# Patient Record
Sex: Male | Born: 1960 | Race: White | Hispanic: No | Marital: Married | Smoking: Current every day smoker
Health system: Southern US, Community
[De-identification: ages and names within clinical notes are randomized; demographics above are authoritative.]

## PROBLEM LIST (undated history)

## (undated) DIAGNOSIS — I10 Essential (primary) hypertension: Secondary | ICD-10-CM

## (undated) DIAGNOSIS — E119 Type 2 diabetes mellitus without complications: Secondary | ICD-10-CM

## (undated) DIAGNOSIS — I219 Acute myocardial infarction, unspecified: Secondary | ICD-10-CM

## (undated) HISTORY — PX: APPENDECTOMY: SHX54

---

## 2004-04-26 DIAGNOSIS — I219 Acute myocardial infarction, unspecified: Secondary | ICD-10-CM

## 2004-04-26 HISTORY — DX: Acute myocardial infarction, unspecified: I21.9

## 2004-04-26 HISTORY — PX: CORONARY ANGIOPLASTY WITH STENT PLACEMENT: SHX49

## 2017-09-16 ENCOUNTER — Encounter: Payer: Self-pay | Admitting: *Deleted

## 2017-09-16 ENCOUNTER — Ambulatory Visit (INDEPENDENT_AMBULATORY_CARE_PROVIDER_SITE_OTHER): Payer: No Typology Code available for payment source

## 2017-09-16 ENCOUNTER — Ambulatory Visit
Admission: EM | Admit: 2017-09-16 | Discharge: 2017-09-16 | Disposition: A | Discharge status: Home / Self Care | Payer: No Typology Code available for payment source | Attending: Family Medicine | Admitting: Family Medicine

## 2017-09-16 DIAGNOSIS — I509 Heart failure, unspecified: Secondary | ICD-10-CM

## 2017-09-16 HISTORY — DX: Acute myocardial infarction, unspecified: I21.9

## 2017-09-16 HISTORY — DX: Essential (primary) hypertension: I10

## 2017-09-16 HISTORY — DX: Type 2 diabetes mellitus without complications: E11.9

## 2017-09-16 NOTE — Discharge Instructions (Addendum)
Please go straight to the ER. [address], [place] [address]  Best of luck.  Take care  Dr. Adriana Simas

## 2017-09-16 NOTE — ED Provider Notes (Signed)
[facility]  CSN: [id number] Arrival date & time: 09/16/17  1451  History   Chief Complaint Congestion  HPI  57 year old male presents with congestion.  Patient states that on Wednesday he developed diarrhea and was not feeling well.  On Thursday he developed head congestion.  Today he has had congestion and cough.  He states that he slept well last night but has had some difficulty with shortness of breath when lying down today.  He denies any lower extremity edema.  No fever or chills.  His primary concern is his congestion.  He is a current smoker.  He has a cardiac history.  No known history of CHF.  He denies a history of COPD.  Shortness of breath is worse when lying down.  When he sits up it improves.  No other associated symptoms.  No other complaints.  Past Medical History:  Diagnosis Date  . Diabetes mellitus without complication (HCC)   . Heart attack (HCC) 2006  . Hypertension    Past Surgical History:  Procedure Laterality Date  . APPENDECTOMY    . CORONARY ANGIOPLASTY WITH STENT PLACEMENT  2006   Home Medications    Prior to Admission medications   Medication Sig Start Date End Date Taking? Authorizing Provider  amLODipine (NORVASC) 5 MG tablet Take 5 mg by mouth daily.   Yes Emergency, Nurse, RN  aspirin EC 81 MG tablet Take 81 mg by mouth daily.   Yes Emergency, Nurse, RN  enalapril-hydrochlorothiazide (VASERETIC) 10-25 MG tablet Take 1 tablet by mouth daily.   Yes Emergency, Nurse, RN  glimepiride (AMARYL) 4 MG tablet Take 4 mg by mouth daily with breakfast.   Yes Emergency, Nurse, RN  metFORMIN (GLUCOPHAGE) 1000 MG tablet Take 1,000 mg by mouth 2 (two) times daily with a meal.   Yes Emergency, Nurse, RN  Metoprolol-hydroCHLOROthiazide (METOPROLOL-HCTZ ER PO) Take 50 mg by mouth.   Yes Emergency, Nurse, RN  Omega-3 Fatty Acids (FISH OIL) 1000 MG CAPS Take by mouth 2 (two) times daily.   Yes Emergency, Nurse, RN  simvastatin (ZOCOR) 20 MG tablet Take 20  mg by mouth daily.   Yes Emergency, Nurse, RN  vitamin E 400 UNIT capsule Take 400 Units by mouth daily.   Yes Emergency, Nurse, RN   Social History Social History   Tobacco Use  . Smoking status: Current Every Day Smoker  . Smokeless tobacco: Never Used  . Tobacco comment: 0-.5 pack a week  Substance Use Topics  . Alcohol use: Not on file  . Drug use: Not on file    Allergies   Patient has no allergy information on record.   Review of Systems Review of Systems  Constitutional: Negative for fever.  HENT: Positive for congestion.   Respiratory: Positive for cough and shortness of breath.    Physical Exam Triage Vital Signs ED Triage Vitals [09/16/17 1506]  Enc Vitals Group     BP (!) 148/81     Pulse Rate 73     Resp 20     Temp 98.4 F (36.9 C)     Temp Source Oral     SpO2 92 %     Weight 240 lb (108.9 kg)     Height  (1.753 m)     Head Circumference      Peak Flow      Pain Score 0     Pain Loc      Pain Edu?      Excl.  in GC?    Updated Vital Signs BP (!) 148/81   Pulse 73   Temp 98.4 F (36.9 C) (Oral)   Resp 20   Ht  (1.753 m)   Wt 240 lb (108.9 kg)   SpO2 92%   BMI 35.44 kg/m  Physical Exam  Constitutional: He is oriented to person, place, and time. He appears well-developed. No distress.  HENT:  Head: Normocephalic and atraumatic.  Eyes: Conjunctivae are normal. No scleral icterus.  Cardiovascular: Normal rate and regular rhythm.  1-2+ pitting lower extremity edema.  Pulmonary/Chest: Effort normal.  Basilar crackles.  Abdominal: Soft. There is no tenderness.  Neurological: He is alert and oriented to person, place, and time.  Psychiatric: He has a normal mood and affect. His behavior is normal.  Nursing note and vitals reviewed.  UC Treatments / Results  Labs (all labs ordered are listed, but only abnormal results are displayed) Labs Reviewed - No data to display  EKG None  Radiology Dg Chest 2 View  Result Date:  09/16/2017 CLINICAL DATA:  Cough and short of breath EXAM: CHEST - 2 VIEW COMPARISON:  None. FINDINGS: Heart size upper normal. Prominent interstitial lung markings bilaterally. No focal consolidation or effusion. Lung volume normal. Atherosclerotic calcification aortic arch IMPRESSION: Heart size upper normal. Interstitial edema most compatible with mild heart failure. No effusion. Electronically Signed   By: Marlan Palau M.D.   On: 09/16/2017 15:34    Procedures Procedures (including critical care time)  Medications Ordered in UC Medications - No data to display  Initial Impression / Assessment and Plan / UC Course  I have reviewed the triage vital signs and the nursing notes.  Pertinent labs & imaging results that were available during my care of the patient were reviewed by me and considered in my medical decision making (see chart for details).    57 year old male presents with cough, congestion, shortness of breath particularly when lying flat.  Clinically appears to be experiencing mild CHF.  X-ray consistent with this.  Sending to the ER for diuresis, further work-up, and possible admission.  Final Clinical Impressions(s) / UC Diagnoses   Final diagnoses:  Acute congestive heart failure, unspecified heart failure type [facility])     Discharge Instructions     Please go straight to the ER. [address], [place], [place] [address]  Best of luck.  Take care  Dr. Adriana Simas     ED Prescriptions    None     Controlled Substance Prescriptions [place] Controlled Substance Registry consulted? Not Applicable   Tommie Sams, DO 09/16/17 1550

## 2017-09-16 NOTE — ED Triage Notes (Signed)
Pt c/o chest congestion. States he is very congested and cannot breathe when he lies down.

## 2019-02-14 IMAGING — CR DG CHEST 2V
2 series · 2 of 2 positions shown · non-contrast
Comparison: None.

CLINICAL DATA: Cough and short of breath

EXAM:
CHEST - 2 VIEW

[chest pa]
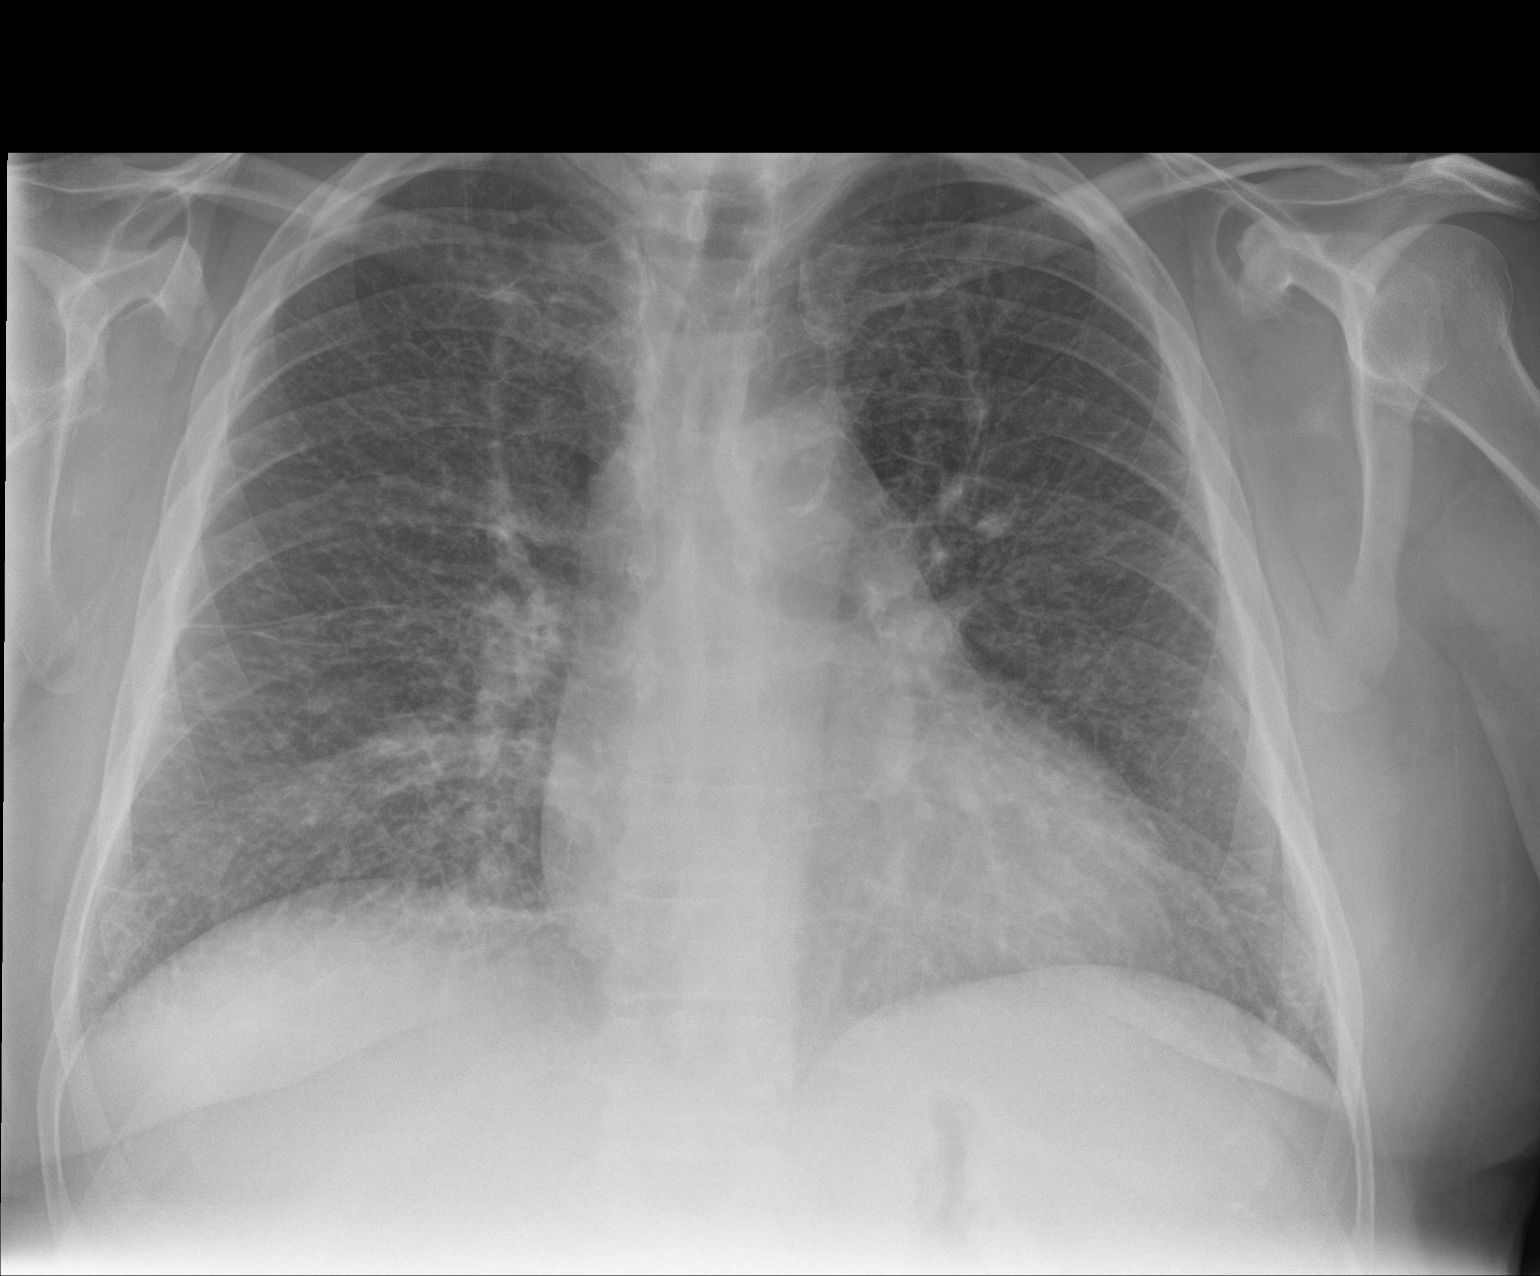

[chest lat]
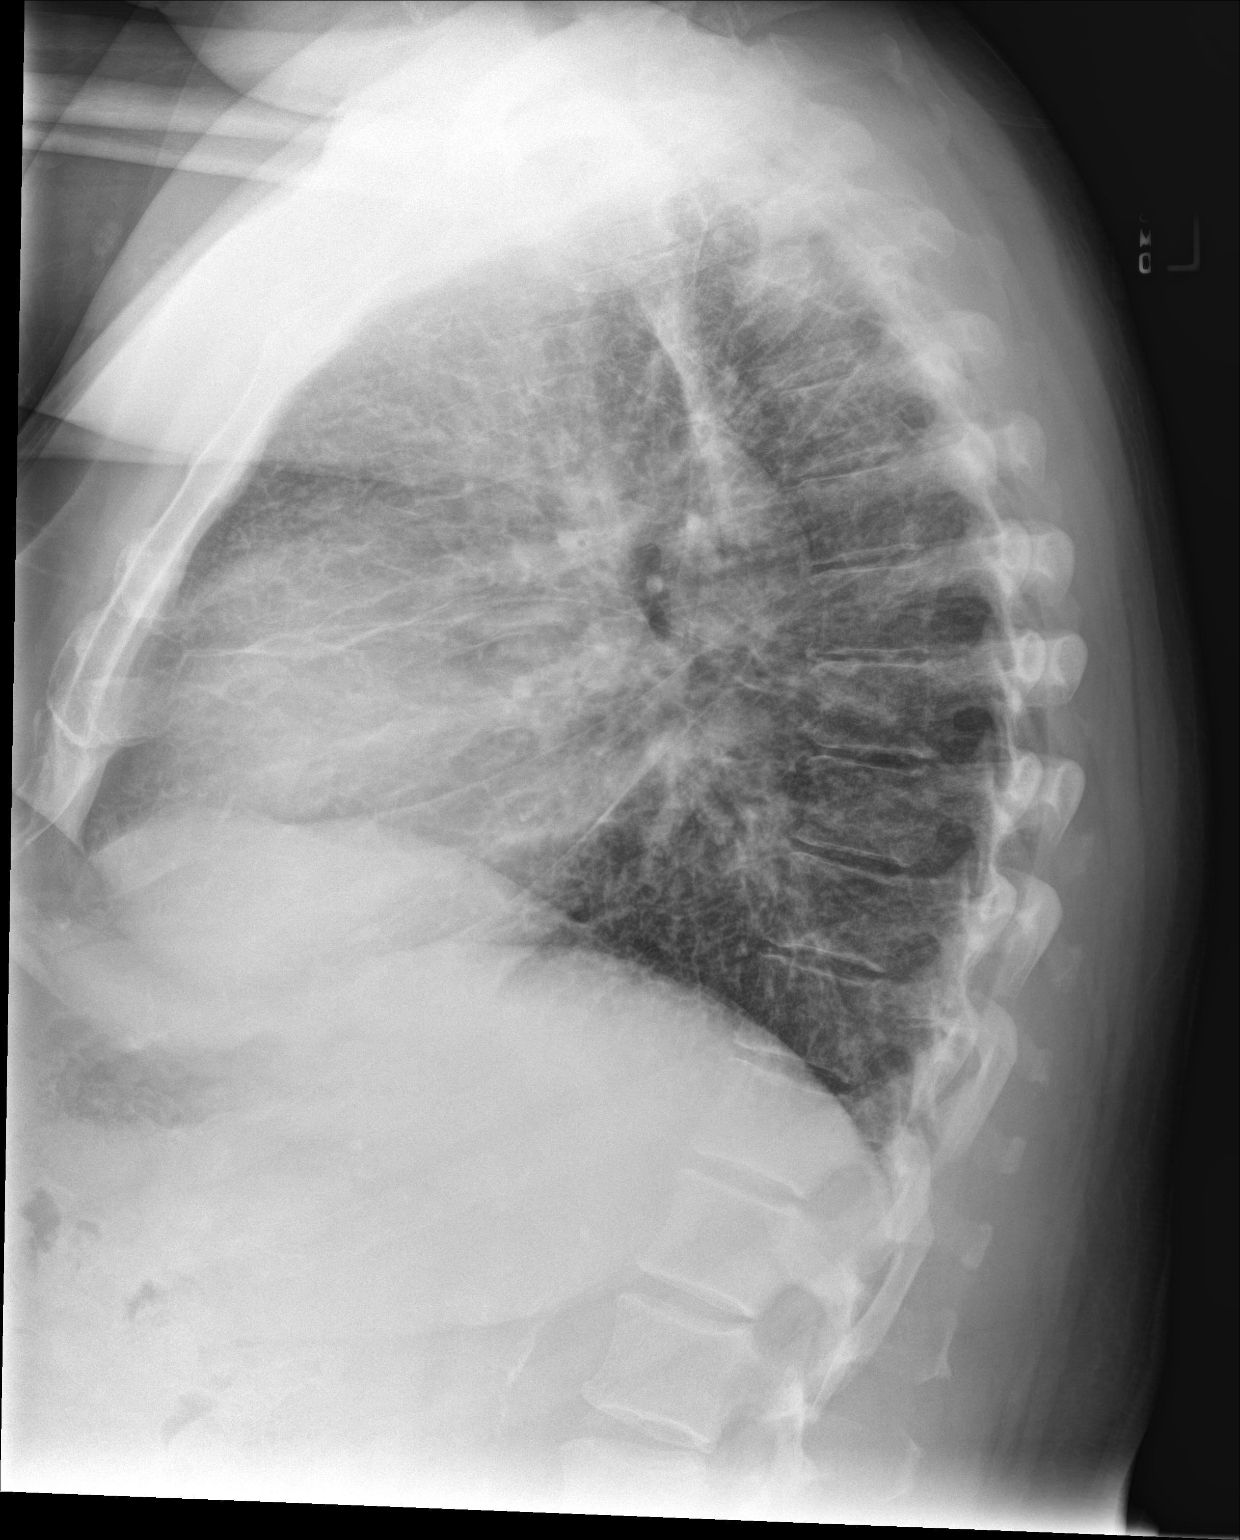

[2 of 2 positions shown; findings below may reference images not displayed]

FINDINGS: Heart size upper normal. Prominent interstitial lung markings
bilaterally. No focal consolidation or effusion. Lung volume normal.

Atherosclerotic calcification aortic arch
IMPRESSION: Heart size upper normal. Interstitial edema most compatible with
mild heart failure. No effusion.
